# Patient Record
Sex: Male | Born: 1960 | Race: Black or African American | Hispanic: No | Marital: Married | Smoking: Never smoker
Health system: Southern US, Community
[De-identification: ages and names within clinical notes are randomized; demographics above are authoritative.]

## PROBLEM LIST (undated history)

## (undated) DIAGNOSIS — I10 Essential (primary) hypertension: Secondary | ICD-10-CM

## (undated) DIAGNOSIS — E785 Hyperlipidemia, unspecified: Secondary | ICD-10-CM

## (undated) HISTORY — DX: Hyperlipidemia, unspecified: E78.5

## (undated) HISTORY — DX: Essential (primary) hypertension: I10

---

## 1999-03-29 ENCOUNTER — Emergency Department (HOSPITAL_COMMUNITY): Admission: EM | Admit: 1999-03-29 | Discharge: 1999-03-29 | Payer: Self-pay | Admitting: *Deleted

## 1999-03-30 ENCOUNTER — Encounter: Payer: Self-pay | Admitting: Emergency Medicine

## 1999-11-26 ENCOUNTER — Emergency Department (HOSPITAL_COMMUNITY): Admission: EM | Admit: 1999-11-26 | Discharge: 1999-11-26 | Payer: Self-pay | Admitting: Emergency Medicine

## 2009-06-19 ENCOUNTER — Emergency Department (HOSPITAL_COMMUNITY): Admission: EM | Admit: 2009-06-19 | Discharge: 2009-06-20 | Payer: Self-pay | Admitting: Emergency Medicine

## 2012-04-20 ENCOUNTER — Ambulatory Visit: Payer: Self-pay

## 2012-04-20 ENCOUNTER — Other Ambulatory Visit: Payer: Self-pay | Admitting: Occupational Medicine

## 2012-04-20 DIAGNOSIS — M549 Dorsalgia, unspecified: Secondary | ICD-10-CM

## 2013-05-21 ENCOUNTER — Ambulatory Visit (INDEPENDENT_AMBULATORY_CARE_PROVIDER_SITE_OTHER): Payer: BC Managed Care – PPO | Admitting: Family Medicine

## 2013-05-21 VITALS — BP 120/82 | HR 61 | Temp 99.6°F | Resp 16 | Ht 70.0 in | Wt 180.0 lb

## 2013-05-21 DIAGNOSIS — H109 Unspecified conjunctivitis: Secondary | ICD-10-CM

## 2013-05-21 MED ORDER — TOBRAMYCIN 0.3 % OP SOLN: 1.0000 [drp] | Freq: Four times a day (QID) | OPHTHALMIC | Status: DC

## 2013-05-21 NOTE — Progress Notes (Signed)
This chart was scribed for Dylan Sidle, MD by Arlan Organ, ED Scribe. This patient was seen in room Room 5 and the patient's care was started 10:36 AM.   Chief Complaint:  Chief Complaint  Patient presents with  . Eye Drainage    x 2 days; left eye    HPI: 52 y.o. year old male presents with a 2 day history of gradual onset, gradually worsening eye redness and discharge involving the left eye. Pt states he does not feel he has a foreign body in his eye, and is currently not experiencing any pain to the eye. He denies contact use. He denies a h/o glaucoma. He denies any sick contacts, recent antibiotics, or recent travels. Denies any leg trauma, sedentary periods. He denies h/o cancer. Denies tobacco use.  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 No pertinent past medical history.   Home Meds: Prior to Admission medications   Medication Sig Start Date End Date Taking? Authorizing Provider  metFORMIN (GLUCOPHAGE) 500 MG tablet Take by mouth 2 (two) times daily with a meal.   Yes Historical Provider, MD  olmesartan (BENICAR) 5 MG tablet Take by mouth daily.   Yes Historical Provider, MD  rosuvastatin (CRESTOR) 10 MG tablet Take 10 mg by mouth daily.   Yes Historical Provider, MD    Allergies: Not on File  History   Social History  . Marital Status: Married    Spouse Name: N/A    Number of Children: N/A  . Years of Education: N/A   Occupational History  . Not on file.   Social History Main Topics  . Smoking status: Not on file  . Smokeless tobacco: Not on file  . Alcohol Use: Not on file  . Drug Use: Not on file  . Sexual Activity: Not on file   Other Topics Concern  . Not on file   Social History Narrative  . No narrative on file     Review of Systems: Constitutional: negative for chills, fever, night sweats or weight changes Cardiovascular: negative for chest pain or palpitations Respiratory: negative for hemoptysis, wheezing, or shortness of  breath Abdominal: negative for abdominal pain, nausea, vomiting or diarrhea Dermatological: negative for rash Neurologic: negative for headache   Physical Exam: Blood pressure 120/82, pulse 61, temperature 99.6 F (37.6 C), temperature source Oral, resp. rate 16, height 5\' 10"  (1.778 m), weight 180 lb (81.647 kg), SpO2 99.00%., Body mass index is 25.83 kg/(m^2). General: Well developed, well nourished, in no acute distress. Head: Normocephalic, atraumatic, nares are congested. Bilateral auditory canals clear, TM's are without perforation, pearly grey with reflective cone of light bilaterally. No sinus TTP. Oral cavity moist, dentition normal. Posterior pharynx with post nasal drip and mild erythema. No peritonsillar abscess or tonsillar exudate. Eyes:  Injection  Left eye, mild discharge at lid margins Neck: Supple. No thyromegaly. Full ROM. No lymphadenopathy. Lungs: Coarse breath sounds bilaterally without wheezes, rales, or rhonchi. Breathing is unlabored.  Heart: RRR with S1 S2. No murmurs, rubs, or gallops appreciated. Msk:  Strength and tone normal for age. Extremities: No clubbing or cyanosis. No edema. Neuro: Alert and oriented X 3. Moves all extremities spontaneously. CNII-XII grossly in tact. Psych:  Responds to questions appropriately with a normal affect.    ASSESSMENT AND PLAN:  52 y.o. year old male with conjunctivitis involving. -Conjunctivitis - Plan: tobramycin (TOBREX) 0.3 % ophthalmic solution   Pt ed given including hygiene measures and avoiding use of contact lenses -RTC  precautions -RTC 3-5 days if no improvement  Signed, Dylan Sidle, MD 05/21/2013 10:32 AM

## 2013-05-21 NOTE — Patient Instructions (Signed)

## 2013-05-27 ENCOUNTER — Ambulatory Visit (INDEPENDENT_AMBULATORY_CARE_PROVIDER_SITE_OTHER): Payer: BC Managed Care – PPO | Admitting: Emergency Medicine

## 2013-05-27 VITALS — BP 130/70 | HR 67 | Temp 98.9°F | Resp 16 | Ht 70.0 in | Wt 180.0 lb

## 2013-05-27 DIAGNOSIS — H109 Unspecified conjunctivitis: Secondary | ICD-10-CM

## 2013-05-27 MED ORDER — OFLOXACIN 0.3 % OP SOLN: 1.0000 [drp] | OPHTHALMIC | Status: DC

## 2013-05-27 NOTE — Progress Notes (Signed)
   Subjective:    Patient ID: Dylan Schaefer, male    DOB: [birth year], 52 y.o.   MRN: [id number]  HPI seen 6 days ago with conjunctivitis of the left. He was treated with tobrex antibiotic drops. His left eye seemed to be better and then in the last 2 days he has developed increasing redness and irritation of his right. He has significant mattering of his eyes and feels it does affect his vision.    Review of Systems     Objective:   Physical Exam the conjunctivae are irritated in both eyes. There is mattering of the lashes of both eyes. Pupils are equal and reactive to light. There is a small preauricular node on left.        Assessment & Plan:  I suspect this is EKC. We'll go ahead and put him on Ocuflox drops make referral for him to be seen Monday

## 2013-07-19 ENCOUNTER — Ambulatory Visit: Payer: BC Managed Care – PPO

## 2013-07-19 ENCOUNTER — Ambulatory Visit (INDEPENDENT_AMBULATORY_CARE_PROVIDER_SITE_OTHER): Payer: BC Managed Care – PPO | Admitting: Emergency Medicine

## 2013-07-19 VITALS — BP 116/72 | HR 66 | Temp 98.8°F | Resp 18 | Ht 69.5 in | Wt 175.0 lb

## 2013-07-19 DIAGNOSIS — R042 Hemoptysis: Secondary | ICD-10-CM

## 2013-07-19 DIAGNOSIS — J209 Acute bronchitis, unspecified: Secondary | ICD-10-CM

## 2013-07-19 MED ORDER — PROMETHAZINE-CODEINE 6.25-10 MG/5ML PO SYRP: 5.0000 mL | ORAL_SOLUTION | Freq: Four times a day (QID) | ORAL | Status: AC | PRN

## 2013-07-19 MED ORDER — AZITHROMYCIN 250 MG PO TABS: ORAL_TABLET | ORAL | Status: AC

## 2013-07-19 NOTE — Patient Instructions (Signed)
Bronchitis °Bronchitis is inflammation of the airways that extend from the windpipe into the lungs (bronchi). The inflammation often causes mucus to develop, which leads to a cough. If the inflammation becomes severe, it may cause shortness of breath. °CAUSES  °Bronchitis may be caused by:  °· Viral infections.   °· Bacteria.   °· Cigarette smoke.   °· Allergens, pollutants, and other irritants.   °SIGNS AND SYMPTOMS  °The most common symptom of bronchitis is a frequent cough that produces mucus. Other symptoms include: °· Fever.   °· Body aches.   °· Chest congestion.   °· Chills.   °· Shortness of breath.   °· Sore throat.   °DIAGNOSIS  °Bronchitis is usually diagnosed through a medical history and physical exam. Tests, such as chest X-rays, are sometimes done to rule out other conditions.  °TREATMENT  °You may need to avoid contact with whatever caused the problem (smoking, for example). Medicines are sometimes needed. These may include: °· Antibiotics. These may be prescribed if the condition is caused by bacteria. °· Cough suppressants. These may be prescribed for relief of cough symptoms.   °· Inhaled medicines. These may be prescribed to help open your airways and make it easier for you to breathe.   °· Steroid medicines. These may be prescribed for those with recurrent (chronic) bronchitis. °HOME CARE INSTRUCTIONS °· Get plenty of rest.   °· Drink enough fluids to keep your urine clear or pale yellow (unless you have a medical condition that requires fluid restriction). Increasing fluids may help thin your secretions and will prevent dehydration.   °· Only take over-the-counter or prescription medicines as directed by your health care provider. °· Only take antibiotics as directed. Make sure you finish them even if you start to feel better. °· Avoid secondhand smoke, irritating chemicals, and strong fumes. These will make bronchitis worse. If you are a smoker, quit smoking. Consider using nicotine gum or  skin patches to help control withdrawal symptoms. Quitting smoking will help your lungs heal faster.   °· Put a cool-mist humidifier in your bedroom at night to moisten the air. This may help loosen mucus. Change the water in the humidifier daily. You can also run the hot water in your shower and sit in the bathroom with the door closed for 5 10 minutes.   °· Follow up with your health care provider as directed.   °· Wash your hands frequently to avoid catching bronchitis again or spreading an infection to others.   °SEEK MEDICAL CARE IF: °Your symptoms do not improve after 1 week of treatment.  °SEEK IMMEDIATE MEDICAL CARE IF: °· Your fever increases. °· You have chills.   °· You have chest pain.   °· You have worsening shortness of breath.   °· You have bloody sputum. °· You faint.   °· You have lightheadedness. °· You have a severe headache.   °· You vomit repeatedly. °MAKE SURE YOU:  °· Understand these instructions. °· Will watch your condition. °· Will get help right away if you are not doing well or get worse. °Document Released: 05/19/2005 Document Revised: 03/09/2013 Document Reviewed: 01/11/2013 °ExitCare® Patient Information ©2014 [employer]. ° ° ° °Hemoptysis °Hemoptysis, which means coughing up blood, can be a sign of a minor problem or a serious medical condition. The blood that is coughed up may come from the lungs and airways. Coughed-up blood can also come from bleeding that occurs outside the lungs and airways. Blood can drain into the windpipe during a severe nosebleed or when blood is vomited from the stomach. Because hemoptysis can be a sign of something serious,   a medical evaluation is required. For some people with hemoptysis, no definite cause is ever identified. °CAUSES  °The most common cause of hemoptysis is bronchitis. Some other common causes include:  °· A ruptured blood vessel caused by coughing or an infection.   °· A medical condition that causes damage to the large air  passageways (bronchiectasis).   °· A blood clot in the lungs (pulmonary embolism).   °· Pneumonia.   °· Tuberculosis.   °· Breathing in a small foreign object.   °· Cancer. °For some people with hemoptysis, no definite cause is ever identified.   °HOME CARE INSTRUCTIONS °· Only take over-the-counter or prescription medicines as directed by your caregiver. Do not use cough suppressants unless your caregiver approves. °· If your caregiver prescribes antibiotic medicines, take them as directed. Finish them even if you start to feel better. °· Do not smoke. Also avoid secondhand smoke. °· Follow up with your caregiver as directed. °SEEK IMMEDIATE MEDICAL CARE IF:  °· You cough up bloody mucus for longer than a week. °· You have a blood-producing cough that is severe or getting worse. °· You have a blood-producing cough that comes and goes over time. °· You develop problems with your breathing.   °· You vomit blood. °· You develop bloody or black-colored stools. °· You have chest pain.   °· You develop night sweats. °· You feel faint or pass out.   °· You have a fever or persistent symptoms for more than 2 3 days.   °· You have a fever and your symptoms suddenly get worse. °MAKE SURE YOU: °· Understand these instructions. °· Will watch your condition. °· Will get help right away if you are not doing well or get worse. °Document Released: 07/28/2001 Document Revised: 05/05/2012 Document Reviewed: 03/05/2012 °ExitCare® Patient Information ©2014 [employer]. ° °

## 2013-07-19 NOTE — Progress Notes (Signed)
[facility] [address], [place] [address] [phone]  Date:  07/19/2013   Name:  Dylan NapRobert S Arutyunyan   DOB:  03/01/1961   MRN:  [id number]  PCP:  Geraldo PitterBLAND,VEITA J, MD    Chief Complaint: Cough and Chills   History of Present Illness:  Dylan NapRobert S Klugh is a 53 y.o. very pleasant male patient who presents with the following:  Cough since late last week with purulent sputum occasionally blood tinged.  No wheezing or shortness of breath.  Some pain with coughing. Had sore throat Friday.  Now resolved.  Had a fever and chills over the weekend.  No coryza or nausea or vomiting.  No improvement with over the counter medications or other home remedies. Denies other complaint or health concern today.   Non smoker.    There are no active problems to display for this patient.   No past medical history on file.  No past surgical history on file.  History  Substance Use Topics  . Smoking status: Never Smoker   . Smokeless tobacco: Not on file  . Alcohol Use: Not on file    Family History  Problem Relation Age of Onset  . Hypertension Mother   . Hypertension Father     No Known Allergies  Medication list has been reviewed and updated.  Current Outpatient Prescriptions on File Prior to Visit  Medication Sig Dispense Refill  . metFORMIN (GLUCOPHAGE) 500 MG tablet Take by mouth 2 (two) times daily with a meal.      . olmesartan (BENICAR) 5 MG tablet Take by mouth daily.      . rosuvastatin (CRESTOR) 10 MG tablet Take 10 mg by mouth daily.       No current facility-administered medications on file prior to visit.    Review of Systems:  As per HPI, otherwise negative.    Physical Examination: Filed Vitals:   07/19/13 1349  BP: 116/72  Pulse: 66  Temp: 98.8 F (37.1 C)  Resp: 18   Filed Vitals:   07/19/13 1349  Height: 5' 9.5" (1.765 m)  Weight: 175 lb (79.379 kg)   Body mass index is 25.48 kg/(m^2). Ideal Body Weight: Weight in (lb) to have BMI  = 25: 171.4  GEN: WDWN, NAD, Non-toxic, A & O x 3 HEENT: Atraumatic, Normocephalic. Neck supple. No masses, No LAD. Ears and Nose: No external deformity. CV: RRR, No M/G/R. No JVD. No thrill. No extra heart sounds. PULM: CTA B, no wheezes, crackles, rhonchi. No retractions. No resp. distress. No accessory muscle use. ABD: S, NT, ND, +BS. No rebound. No HSM. EXTR: No c/c/e NEURO Normal gait.  PSYCH: Normally interactive. Conversant. Not depressed or anxious appearing.  Calm demeanor.    Assessment and Plan: Bronchitis hemoptysis Signed,  Phillips OdorJeffery Tayshun Gappa, MD   [facility] reading (PRIMARY) by  Dr. Dareen PianoAnderson.  Negative chest.

## 2016-07-26 ENCOUNTER — Ambulatory Visit (INDEPENDENT_AMBULATORY_CARE_PROVIDER_SITE_OTHER): Payer: BLUE CROSS/BLUE SHIELD | Admitting: Emergency Medicine

## 2016-07-26 VITALS — BP 124/70 | HR 55 | Temp 98.4°F | Ht 69.5 in | Wt 169.8 lb

## 2016-07-26 DIAGNOSIS — Z202 Contact with and (suspected) exposure to infections with a predominantly sexual mode of transmission: Secondary | ICD-10-CM

## 2016-07-26 LAB — POCT URINALYSIS DIP (MANUAL ENTRY)
Bilirubin, UA: NEGATIVE
Glucose, UA: NEGATIVE
Ketones, POC UA: NEGATIVE
Leukocytes, UA: NEGATIVE
Nitrite, UA: NEGATIVE
PH UA: 7.5
RBC UA: NEGATIVE
Spec Grav, UA: 1.02
UROBILINOGEN UA: 1

## 2016-07-26 MED ORDER — METRONIDAZOLE 500 MG PO TABS: 500.0000 mg | ORAL_TABLET | Freq: Three times a day (TID) | ORAL | 0 refills | Status: AC

## 2016-07-26 NOTE — Progress Notes (Signed)
Dylan Schaefer 56 y.o.   Chief Complaint  Patient presents with  . Exposure to STD    HISTORY OF PRESENT ILLNESS: This is a 56 y.o. male exposed to trichomonas; seeking treatment and advise. Asymptomatic.  HPI   Prior to Admission medications   Medication Sig Start Date End Date Taking? Authorizing Provider  metFORMIN (GLUCOPHAGE) 500 MG tablet Take by mouth 2 (two) times daily with a meal.   Yes Historical Provider, MD  olmesartan (BENICAR) 5 MG tablet Take by mouth daily.   Yes Historical Provider, MD  promethazine-codeine (PHENERGAN WITH CODEINE) 6.25-10 MG/5ML syrup Take 5-10 mLs by mouth every 6 (six) hours as needed. 07/19/13  Yes Carmelina Dane, MD  rosuvastatin (CRESTOR) 10 MG tablet Take 10 mg by mouth daily.   Yes Historical Provider, MD  azithromycin (ZITHROMAX) 250 MG tablet Take 2 tabs PO x 1 dose, then 1 tab PO QD x 4 days Patient not taking: Reported on 07/26/2016 07/19/13   Carmelina Dane, MD    No Known Allergies  There are no active problems to display for this patient.   Past Medical History:  Diagnosis Date  . Hyperlipidemia   . Hypertension     History reviewed. No pertinent surgical history.  Social History   Social History  . Marital status: Married    Spouse name: N/A  . Number of children: N/A  . Years of education: N/A   Occupational History  . Not on file.   Social History Main Topics  . Smoking status: Never Smoker  . Smokeless tobacco: Never Used  . Alcohol use 1.2 oz/week    2 Cans of beer per week  . Drug use: No  . Sexual activity: Not on file   Other Topics Concern  . Not on file   Social History Narrative  . No narrative on file    Family History  Problem Relation Age of Onset  . Hypertension Mother   . Hypertension Father      Review of Systems  Constitutional: Negative for chills and fever.  Respiratory: Negative for cough and shortness of breath.   Gastrointestinal: Negative for abdominal pain,  diarrhea, nausea and vomiting.  Genitourinary: Negative for dysuria, frequency, hematuria and urgency.  Musculoskeletal: Negative for myalgias.  Skin: Negative for rash.  Neurological: Negative for dizziness and headaches.  All other systems reviewed and are negative.   Vitals:   07/26/16 0842  BP: 124/70  Pulse: (!) 55  Temp: 98.4 F (36.9 C)    Physical Exam  Constitutional: He is oriented to person, place, and time. He appears well-developed and well-nourished.  HENT:  Head: Normocephalic and atraumatic.  Eyes: Conjunctivae and EOM are normal. Pupils are equal, round, and reactive to light.  Neck: Normal range of motion. Neck supple.  Cardiovascular: Normal rate, regular rhythm and normal heart sounds.   Pulmonary/Chest: Effort normal.  Abdominal: Soft. He exhibits no distension. There is no tenderness.  Musculoskeletal: Normal range of motion.  Neurological: He is alert and oriented to person, place, and time.  Skin: Skin is warm and dry. Capillary refill takes less than 2 seconds.  Psychiatric: He has a normal mood and affect. His behavior is normal.  Vitals reviewed.    ASSESSMENT & PLAN: Dylan Schaefer was seen today for exposure to std.  Diagnoses and all orders for this visit:  Exposure to trichomonas -     Chlamydia trachomatis, DNA, amp probe -     Gonococcus DNA, PCR -  Urine culture -     POCT urinalysis dipstick -     Trichomonas vaginalis, RNA  Other orders -     metroNIDAZOLE (FLAGYL) 500 MG tablet; Take 1 tablet (500 mg total) by mouth 3 (three) times daily.   Patient Instructions       IF you received an x-ray today, you will receive an invoice from [facility]. Please contact [facility] at [phone] with questions or concerns regarding your invoice.   IF you received labwork today, you will receive an invoice from [facility]. Please contact [facility] at [phone] with questions or concerns regarding your invoice.   Our  billing staff will not be able to assist you with questions regarding bills from these companies.  You will be contacted with the lab results as soon as they are available. The fastest way to get your results is to activate your My Chart account. Instructions are located on the last page of this paperwork. If you have not heard from us regarding the results in 2 weeks, please contact this office.     Trichomoniasis Trichomoniasis is an infection caused by an organism called Trichomonas. The infection can affect both women and men. In women, the outer male genitalia and the vagina are affected. In men, the penis is mainly affected, but the prostate and other reproductive organs can also be involved. Trichomoniasis is a sexually transmitted infection (STI) and is most often passed to another person through sexual contact.  RISK FACTORS  Having unprotected sexual intercourse.  Having sexual intercourse with an infected partner. SIGNS AND SYMPTOMS  Symptoms of trichomoniasis in women include:  Abnormal gray-green frothy vaginal discharge.  Itching and irritation of the vagina.  Itching and irritation of the area outside the vagina. Symptoms of trichomoniasis in men include:   Penile discharge with or without pain.  Pain during urination. This results from inflammation of the urethra. DIAGNOSIS  Trichomoniasis may be found during a Pap test or physical exam. Your health care provider may use one of the following methods to help diagnose this infection:  Testing the pH of the vagina with a test tape.  Using a vaginal swab test that checks for the Trichomonas organism. A test is available that provides results within a few minutes.  Examining a urine sample.  Testing vaginal secretions. Your health care provider may test you for other STIs, including HIV. TREATMENT   You may be given medicine to fight the infection. Women should inform their health care provider if they could be or  are pregnant. Some medicines used to treat the infection should not be taken during pregnancy.  Your health care provider may recommend over-the-counter medicines or creams to decrease itching or irritation.  Your sexual partner will need to be treated if infected.  Your health care provider may test you for infection again 3 months after treatment. HOME CARE INSTRUCTIONS   Take medicines only as directed by your health care provider.  Take over-the-counter medicine for itching or irritation as directed by your health care provider.  Do not have sexual intercourse while you have the infection.  Women should not douche or wear tampons while they have the infection.  Discuss your infection with your partner. Your partner may have gotten the infection from you, or you may have gotten it from your partner.  Have your sex partner get examined and treated if necessary.  Practice safe, informed, and protected sex.  See your health care provider for other STI testing. SEEK  MEDICAL CARE IF:   You still have symptoms after you finish your medicine.  You develop abdominal pain.  You have pain when you urinate.  You have bleeding after sexual intercourse.  You develop a rash.  Your medicine makes you sick or makes you throw up (vomit). MAKE SURE YOU:  Understand these instructions.  Will watch your condition.  Will get help right away if you are not doing well or get worse. This information is not intended to replace advice given to you by your health care provider. Make sure you discuss any questions you have with your health care provider. Document Released: 11/12/2000 Document Revised: 06/09/2014 Document Reviewed: 02/28/2013 Elsevier Interactive Patient Education  2017 [employer]      Edwina Barth, MD [facility]

## 2016-07-26 NOTE — Patient Instructions (Addendum)
   IF you received an x-ray today, you will receive an invoice from [facility]. Please contact [facility] at [phone] with questions or concerns regarding your invoice.   IF you received labwork today, you will receive an invoice from [facility]. Please contact [facility] at [phone] with questions or concerns regarding your invoice.   Our billing staff will not be able to assist you with questions regarding bills from these companies.  You will be contacted with the lab results as soon as they are available. The fastest way to get your results is to activate your My Chart account. Instructions are located on the last page of this paperwork. If you have not heard from us regarding the results in 2 weeks, please contact this office.     Trichomoniasis Trichomoniasis is an infection caused by an organism called Trichomonas. The infection can affect both women and men. In women, the outer male genitalia and the vagina are affected. In men, the penis is mainly affected, but the prostate and other reproductive organs can also be involved. Trichomoniasis is a sexually transmitted infection (STI) and is most often passed to another person through sexual contact.  RISK FACTORS  Having unprotected sexual intercourse.  Having sexual intercourse with an infected partner. SIGNS AND SYMPTOMS  Symptoms of trichomoniasis in women include:  Abnormal gray-green frothy vaginal discharge.  Itching and irritation of the vagina.  Itching and irritation of the area outside the vagina. Symptoms of trichomoniasis in men include:   Penile discharge with or without pain.  Pain during urination. This results from inflammation of the urethra. DIAGNOSIS  Trichomoniasis may be found during a Pap test or physical exam. Your health care provider may use one of the following methods to help diagnose this infection:  Testing the pH of the vagina with a test tape.  Using a vaginal swab  test that checks for the Trichomonas organism. A test is available that provides results within a few minutes.  Examining a urine sample.  Testing vaginal secretions. Your health care provider may test you for other STIs, including HIV. TREATMENT   You may be given medicine to fight the infection. Women should inform their health care provider if they could be or are pregnant. Some medicines used to treat the infection should not be taken during pregnancy.  Your health care provider may recommend over-the-counter medicines or creams to decrease itching or irritation.  Your sexual partner will need to be treated if infected.  Your health care provider may test you for infection again 3 months after treatment. HOME CARE INSTRUCTIONS   Take medicines only as directed by your health care provider.  Take over-the-counter medicine for itching or irritation as directed by your health care provider.  Do not have sexual intercourse while you have the infection.  Women should not douche or wear tampons while they have the infection.  Discuss your infection with your partner. Your partner may have gotten the infection from you, or you may have gotten it from your partner.  Have your sex partner get examined and treated if necessary.  Practice safe, informed, and protected sex.  See your health care provider for other STI testing. SEEK MEDICAL CARE IF:   You still have symptoms after you finish your medicine.  You develop abdominal pain.  You have pain when you urinate.  You have bleeding after sexual intercourse.  You develop a rash.  Your medicine makes you sick or makes you throw up (vomit). MAKE   SURE YOU:  Understand these instructions.  Will watch your condition.  Will get help right away if you are not doing well or get worse. This information is not intended to replace advice given to you by your health care provider. Make sure you discuss any questions you have with  your health care provider. Document Released: 11/12/2000 Document Revised: 06/09/2014 Document Reviewed: 02/28/2013 Elsevier Interactive Patient Education  2017 [employer]vier Inc.  

## 2016-07-28 LAB — URINE CULTURE: Organism ID, Bacteria: NO GROWTH

## 2016-07-29 LAB — TRICHOMONAS VAGINALIS, PROBE AMP: TRICH VAG BY NAA: POSITIVE — AB

## 2016-07-29 LAB — GONOCOCCUS DNA, PCR: Neisseria gonorrhoeae by PCR: NEGATIVE

## 2016-07-29 LAB — CHLAMYDIA TRACHOMATIS, DNA, AMP PROBE: Chlamydia trachomatis, NAA: NEGATIVE

## 2016-08-06 ENCOUNTER — Other Ambulatory Visit: Payer: Self-pay | Admitting: Emergency Medicine

## 2016-12-13 ENCOUNTER — Telehealth: Payer: Self-pay | Admitting: Internal Medicine

## 2016-12-13 NOTE — Telephone Encounter (Signed)
Patient of Dr. Haywood PaoHung's contacted me as the on call M.D. Today Patient reports he has a colonoscopy, which is a rescheduled exam, with Dr. Elnoria HowardHung on Tuesday He has misplaced his procedure instructions and bowel preparation prescription  I reassured him that there is still time to get the prescription on Monday. I advised low residue diet through the weekend and clear liquids beginning at midnight on Monday, 12/15/2016 He will contact Dr. Haywood PaoHung's office on Monday regarding bowel preparation and procedure instructions He thanked me for the call

## 2019-04-08 ENCOUNTER — Other Ambulatory Visit: Payer: Self-pay | Admitting: Family Medicine

## 2019-04-22 LAB — MOLECULAR ANCILLARY ONLY
Bacterial Vaginitis (gardnerella): NEGATIVE
Candida Glabrata: NEGATIVE
Candida Vaginitis: NEGATIVE
Chlamydia: NEGATIVE
Comment: NEGATIVE
Comment: NEGATIVE
Comment: NEGATIVE
Comment: NEGATIVE
Comment: NEGATIVE
Comment: NORMAL
Neisseria Gonorrhea: NEGATIVE
Trichomonas: POSITIVE — AB

## 2019-09-17 ENCOUNTER — Other Ambulatory Visit (HOSPITAL_COMMUNITY)
Admission: RE | Admit: 2019-09-17 | Discharge: 2019-09-17 | Disposition: A | Discharge status: Home / Self Care | Payer: BC Managed Care – PPO | Source: Ambulatory Visit | Attending: Family Medicine | Admitting: Family Medicine

## 2019-09-17 ENCOUNTER — Other Ambulatory Visit: Payer: Self-pay | Admitting: Family Medicine

## 2019-09-17 DIAGNOSIS — R351 Nocturia: Secondary | ICD-10-CM | POA: Insufficient documentation

## 2019-09-17 DIAGNOSIS — Z113 Encounter for screening for infections with a predominantly sexual mode of transmission: Secondary | ICD-10-CM | POA: Diagnosis present

## 2019-10-07 LAB — MOLECULAR ANCILLARY ONLY
Bacterial Vaginitis (gardnerella): NEGATIVE
Candida Glabrata: NEGATIVE
Candida Vaginitis: NEGATIVE
Chlamydia: NEGATIVE
Comment: NEGATIVE
Comment: NEGATIVE
Comment: NEGATIVE
Comment: NEGATIVE
Comment: NEGATIVE
Comment: NORMAL
Neisseria Gonorrhea: NEGATIVE
Trichomonas: NEGATIVE

## 2020-05-27 ENCOUNTER — Emergency Department (HOSPITAL_COMMUNITY)
Admission: EM | Admit: 2020-05-27 | Discharge: 2020-05-27 | Disposition: A | Discharge status: Home / Self Care | Payer: BC Managed Care – PPO | Attending: Emergency Medicine | Admitting: Emergency Medicine

## 2020-05-27 ENCOUNTER — Other Ambulatory Visit: Payer: Self-pay

## 2020-05-27 ENCOUNTER — Encounter (HOSPITAL_COMMUNITY): Payer: Self-pay | Admitting: Emergency Medicine

## 2020-05-27 ENCOUNTER — Emergency Department (HOSPITAL_COMMUNITY): Payer: BC Managed Care – PPO

## 2020-05-27 DIAGNOSIS — Z7982 Long term (current) use of aspirin: Secondary | ICD-10-CM | POA: Insufficient documentation

## 2020-05-27 DIAGNOSIS — I10 Essential (primary) hypertension: Secondary | ICD-10-CM | POA: Diagnosis not present

## 2020-05-27 DIAGNOSIS — Z79899 Other long term (current) drug therapy: Secondary | ICD-10-CM | POA: Insufficient documentation

## 2020-05-27 DIAGNOSIS — R42 Dizziness and giddiness: Secondary | ICD-10-CM | POA: Diagnosis not present

## 2020-05-27 DIAGNOSIS — H81399 Other peripheral vertigo, unspecified ear: Secondary | ICD-10-CM

## 2020-05-27 LAB — COMPREHENSIVE METABOLIC PANEL
ALT: 36 U/L (ref 0–44)
AST: 26 U/L (ref 15–41)
Albumin: 5.2 g/dL — ABNORMAL HIGH (ref 3.5–5.0)
Alkaline Phosphatase: 66 U/L (ref 38–126)
Anion gap: 11 (ref 5–15)
BUN: 16 mg/dL (ref 6–20)
CO2: 29 mmol/L (ref 22–32)
Calcium: 10.1 mg/dL (ref 8.9–10.3)
Chloride: 98 mmol/L (ref 98–111)
Creatinine, Ser: 1.27 mg/dL — ABNORMAL HIGH (ref 0.61–1.24)
GFR, Estimated: >60 mL/min (ref >60)
Glucose, Bld: 99 mg/dL (ref 70–99)
Potassium: 4.5 mmol/L (ref 3.5–5.1)
Sodium: 138 mmol/L (ref 135–145)
Total Bilirubin: 0.6 mg/dL (ref 0.3–1.2)
Total Protein: 8.8 g/dL — ABNORMAL HIGH (ref 6.5–8.1)

## 2020-05-27 LAB — CBC
HCT: 51.5 % (ref 39.0–52.0)
Hemoglobin: 17.1 g/dL — ABNORMAL HIGH (ref 13.0–17.0)
MCH: 28.8 pg (ref 26.0–34.0)
MCHC: 33.2 g/dL (ref 30.0–36.0)
MCV: 86.8 fL (ref 80.0–100.0)
Platelets: 261 10*3/uL (ref 150–400)
RBC: 5.93 MIL/uL — ABNORMAL HIGH (ref 4.22–5.81)
RDW: 13 % (ref 11.5–15.5)
WBC: 20.2 10*3/uL — ABNORMAL HIGH (ref 4.0–10.5)
nRBC: 0 % (ref 0.0–0.2)

## 2020-05-27 LAB — RAPID URINE DRUG SCREEN, HOSP PERFORMED
Amphetamines: NOT DETECTED
Barbiturates: NOT DETECTED
Benzodiazepines: NOT DETECTED
Cocaine: NOT DETECTED
Opiates: NOT DETECTED
Tetrahydrocannabinol: NOT DETECTED

## 2020-05-27 LAB — DIFFERENTIAL
Abs Immature Granulocytes: 0.11 10*3/uL — ABNORMAL HIGH (ref 0.00–0.07)
Basophils Absolute: 0 10*3/uL (ref 0.0–0.1)
Basophils Relative: 0 %
Eosinophils Absolute: 0 10*3/uL (ref 0.0–0.5)
Eosinophils Relative: 0 %
Immature Granulocytes: 1 %
Lymphocytes Relative: 4 %
Lymphs Abs: 0.8 10*3/uL (ref 0.7–4.0)
Monocytes Absolute: 0.9 10*3/uL (ref 0.1–1.0)
Monocytes Relative: 4 %
Neutro Abs: 18.4 10*3/uL — ABNORMAL HIGH (ref 1.7–7.7)
Neutrophils Relative %: 91 %

## 2020-05-27 LAB — URINALYSIS, ROUTINE W REFLEX MICROSCOPIC
Bilirubin Urine: NEGATIVE
Glucose, UA: NEGATIVE mg/dL
Hgb urine dipstick: NEGATIVE
Ketones, ur: NEGATIVE mg/dL
Leukocytes,Ua: NEGATIVE
Nitrite: NEGATIVE
Protein, ur: NEGATIVE mg/dL
Specific Gravity, Urine: 1.005 (ref 1.005–1.030)
pH: 6 (ref 5.0–8.0)

## 2020-05-27 LAB — PROTIME-INR
INR: 1 (ref 0.8–1.2)
Prothrombin Time: 13 seconds (ref 11.4–15.2)

## 2020-05-27 LAB — I-STAT CHEM 8, ED
BUN: 15 mg/dL (ref 6–20)
Calcium, Ion: 1.24 mmol/L (ref 1.15–1.40)
Chloride: 99 mmol/L (ref 98–111)
Creatinine, Ser: 1.2 mg/dL (ref 0.61–1.24)
Glucose, Bld: 103 mg/dL — ABNORMAL HIGH (ref 70–99)
HCT: 51 % (ref 39.0–52.0)
Hemoglobin: 17.3 g/dL — ABNORMAL HIGH (ref 13.0–17.0)
Potassium: 4.5 mmol/L (ref 3.5–5.1)
Sodium: 137 mmol/L (ref 135–145)
TCO2: 28 mmol/L (ref 22–32)

## 2020-05-27 LAB — CBG MONITORING, ED: Glucose-Capillary: 136 mg/dL — ABNORMAL HIGH (ref 70–99)

## 2020-05-27 LAB — APTT: aPTT: 36 seconds (ref 24–36)

## 2020-05-27 MED ORDER — DIAZEPAM 2 MG PO TABS: 2.0000 mg | ORAL_TABLET | Freq: Four times a day (QID) | ORAL | 0 refills | Status: AC | PRN

## 2020-05-27 MED ORDER — MECLIZINE HCL 25 MG PO TABS: 25.0000 mg | ORAL_TABLET | Freq: Three times a day (TID) | ORAL | 0 refills | Status: AC | PRN

## 2020-05-27 MED ORDER — DIAZEPAM 2 MG PO TABS
2.0000 mg | ORAL_TABLET | Freq: Once | ORAL | Status: AC
  Administered 2020-05-27: 16:00:00 2 mg via ORAL
  Filled 2020-05-27: qty 1

## 2020-05-27 MED ORDER — IOHEXOL 350 MG/ML SOLN
80.0000 mL | Freq: Once | INTRAVENOUS | Status: AC | PRN
  Administered 2020-05-27: 80 mL via INTRAVENOUS

## 2020-05-27 MED ORDER — MECLIZINE HCL 25 MG PO TABS
25.0000 mg | ORAL_TABLET | Freq: Once | ORAL | Status: AC
  Administered 2020-05-27: 16:00:00 25 mg via ORAL
  Filled 2020-05-27: qty 1

## 2020-05-27 NOTE — ED Provider Notes (Signed)
[facility]-EMERGENCY DEPT Provider Note   CSN: [id number] Arrival date & time: 05/27/20  1119     History Chief Complaint  Patient presents with  . Dizziness    Dylan Schaefer is a 59 y.o. male presents emergency department room spinning dizziness.  Patient states that he woke from sleep at 2:00 in the morning with room spinning dizziness.  He states when he stood up to go to the bathroom it got worse.  He was stumbling and off balance and felt like he was going to vomit but only dry heaves in the bathroom.  Patient states that eventually it stopped and he was able to go back to sleep.  He has never had anything like it before.  When he awoke he was doing well this morning and proceeded to go to work.  He is a Naval architect for [employer].  When he got to work he began to turn his vehicle on when the room spinning dizziness hit suddenly again.  He states that he stopped the truck and got out.  He called his daughter to come get him to bring him here.  On the way he had several episodes read pullover and dry heaves but did not vomit.  While waiting in the waiting room his symptoms improved but then suddenly started again.  He states that his symptoms are currently moderate.  He is never had anything like this before.  He denies changes in vision, unilateral weakness, difficulty with speech or swallowing.  Aside from the one episode of ataxia yesterday he has had no difficulty ambulating today.  He did not try anything to help with his symptoms.  He states that it feels like being too drunk or being on a ride that makes you sick.  He has a past medical history of hyper glycemia, hyperlipidemia, hypertension.  He denies a history of alcohol or tobacco abuse.  HPI     Past Medical History:  Diagnosis Date  . Hyperlipidemia   . Hypertension     There are no problems to display for this patient.   History reviewed. No pertinent surgical history.     Family History  Problem  Relation Age of Onset  . Hypertension Mother   . Hypertension Father     Social History   Tobacco Use  . Smoking status: Never Smoker  . Smokeless tobacco: Never Used  Substance Use Topics  . Alcohol use: Yes    Alcohol/week: 2.0 standard drinks    Types: 2 Cans of beer per week  . Drug use: No    Home Medications Prior to Admission medications   Medication Sig Start Date End Date Taking? Authorizing Provider  aspirin EC 81 MG tablet Take 81 mg by mouth daily as needed for mild pain. Swallow whole.   Yes [provider]  olmesartan-hydrochlorothiazide (BENICAR HCT) 40-25 MG tablet Take 1 tablet by mouth daily.   Yes [provider]  rosuvastatin (CRESTOR) 10 MG tablet Take 10 mg by mouth daily.   Yes [provider]  sitaGLIPtin (JANUVIA) 100 MG tablet Take 100 mg by mouth daily.   Yes [provider]  Vitamin D, Ergocalciferol, (DRISDOL) 1.25 MG (50000 UNIT) CAPS capsule Take 50,000 Units by mouth every 7 (seven) days. Fridays   Yes [provider]  azithromycin (ZITHROMAX) 250 MG tablet Take 2 tabs PO x 1 dose, then 1 tab PO QD x 4 days Patient not taking: No sig reported 07/19/13  Carmelina Dane, MD  diazepam (VALIUM) 2 MG tablet Take 1 tablet (2 mg total) by mouth every 6 (six) hours as needed (Severe dizziness). 05/27/20   Arthor Captain, PA-C  meclizine (ANTIVERT) 25 MG tablet Take 1 tablet (25 mg total) by mouth 3 (three) times daily as needed for dizziness or nausea. 05/27/20   Arthor Captain, PA-C  metFORMIN (GLUCOPHAGE) 500 MG tablet Take by mouth 2 (two) times daily with a meal. Patient not taking: No sig reported    [provider]  metroNIDAZOLE (FLAGYL) 500 MG tablet Take 1 tablet (500 mg total) by mouth 3 (three) times daily. Patient not taking: No sig reported 07/26/16   Georgina Quint, MD  promethazine-codeine [facility] WITH CODEINE) 6.25-10 MG/5ML syrup Take 5-10 mLs by mouth every 6 (six) hours as  needed. Patient not taking: No sig reported 07/19/13   Carmelina Dane, MD    Allergies    Patient has no known allergies.  Review of Systems   Review of Systems Ten systems reviewed and are negative for acute change, except as noted in the HPI.   Physical Exam Updated Vital Signs BP (!) 144/96 (BP Location: Left Arm)   Pulse 65   Temp 97.7 F (36.5 C) (Oral)   Resp 20   Ht 5\' 8"  (1.727 m)   Wt 90.7 kg   SpO2 97%   BMI 30.41 kg/m   Physical Exam Vitals and nursing note reviewed.  Constitutional:      General: He is not in acute distress.    Appearance: He is well-developed and well-nourished. He is not diaphoretic.  HENT:     Head: Normocephalic and atraumatic.  Eyes:     General: No scleral icterus.    Conjunctiva/sclera: Conjunctivae normal.  Cardiovascular:     Rate and Rhythm: Normal rate and regular rhythm.     Heart sounds: Normal heart sounds.  Pulmonary:     Effort: Pulmonary effort is normal. No respiratory distress.     Breath sounds: Normal breath sounds.  Abdominal:     Palpations: Abdomen is soft.     Tenderness: There is no abdominal tenderness.  Musculoskeletal:        General: No edema.     Cervical back: Normal range of motion and neck supple.  Skin:    General: Skin is warm and dry.  Neurological:     Mental Status: He is alert.     Comments: Speech is clear and goal oriented, follows commands Major Cranial nerves without deficit, no facial droop Normal strength in upper and lower extremities bilaterally including dorsiflexion and plantar flexion, strong and equal grip strength Sensation normal to light and sharp touch Moves extremities without ataxia, coordination intact Normal finger to nose and rapid alternating movements Neg romberg, no pronator drift Normal gait Normal heel-shin and balance   Psychiatric:        Behavior: Behavior normal.     ED Results / Procedures / Treatments   Labs (all labs ordered are listed, but only  abnormal results are displayed) Labs Reviewed  CBC - Abnormal; Notable for the following components:      Result Value   WBC 20.2 (*)    RBC 5.93 (*)    Hemoglobin 17.1 (*)    All other components within normal limits  DIFFERENTIAL - Abnormal; Notable for the following components:   Neutro Abs 18.4 (*)    Abs Immature Granulocytes 0.11 (*)    All other components within normal  limits  COMPREHENSIVE METABOLIC PANEL - Abnormal; Notable for the following components:   Creatinine, Ser 1.27 (*)    Total Protein 8.8 (*)    Albumin 5.2 (*)    All other components within normal limits  URINALYSIS, ROUTINE W REFLEX MICROSCOPIC - Abnormal; Notable for the following components:   Color, Urine STRAW (*)    All other components within normal limits  CBG MONITORING, ED - Abnormal; Notable for the following components:   Glucose-Capillary 136 (*)    All other components within normal limits  I-STAT CHEM 8, ED - Abnormal; Notable for the following components:   Glucose, Bld 103 (*)    Hemoglobin 17.3 (*)    All other components within normal limits  PROTIME-INR  APTT  RAPID URINE DRUG SCREEN, HOSP PERFORMED    EKG EKG Interpretation  Date/Time:  Sunday May 27 2020 15:06:14 EST Ventricular Rate:  71 PR Interval:    QRS Duration: 71 QT Interval:  380 QTC Calculation: 413 R Axis:   73 Text Interpretation: Sinus rhythm Borderline prolonged PR interval Biatrial enlargement No previous ECGs available Confirmed by Alvira MondaySchlossman, Erin ([id number]) on 05/27/2020 5:55:29 PM   Radiology CT Angio Head W or Wo Contrast  Result Date: 05/27/2020 CLINICAL DATA:  Dizziness.  Nausea. EXAM: CT ANGIOGRAPHY HEAD AND NECK TECHNIQUE: Multidetector CT imaging of the head and neck was performed using the standard protocol during bolus administration of intravenous contrast. Multiplanar CT image reconstructions and MIPs were obtained to evaluate the vascular anatomy. Carotid stenosis measurements (when  applicable) are obtained utilizing NASCET criteria, using the distal internal carotid diameter as the denominator. CONTRAST:  80mL OMNIPAQUE IOHEXOL 350 MG/ML SOLN COMPARISON:  None. FINDINGS: CT HEAD FINDINGS Brain: There is no evidence of an acute infarct, intracranial hemorrhage, mass, midline shift, or extra-axial fluid collection. The ventricles and sulci are normal. Scattered hypodensities in the cerebral white matter nonspecific but compatible with mild chronic small vessel ischemic disease. Vascular: No hyperdense vessel. Skull: No fracture or suspicious osseous lesion. Sinuses: Visualized paranasal sinuses and mastoid air cells are clear. Orbits: Unremarkable. Review of the MIP images confirms the above findings CTA NECK FINDINGS Aortic arch: Standard 3 vessel aortic arch with widely patent arch vessel origins. Right carotid system: Patent without evidence of stenosis or dissection. Left carotid system: Patent without evidence of stenosis or dissection. Vertebral arteries: Patent without evidence of stenosis or dissection. Mildly dominant left vertebral artery. Skeleton: No acute osseous abnormality or suspicious osseous lesion. Other neck: No evidence of cervical lymphadenopathy or mass. Upper chest: Clear lung apices. Review of the MIP images confirms the above findings CTA HEAD FINDINGS Anterior circulation: The internal carotid arteries are widely patent from skull base to carotid termini. ACAs and MCAs are patent without evidence of a proximal branch occlusion or significant proximal stenosis. No aneurysm is identified. Posterior circulation: The intracranial vertebral arteries are widely patent to the basilar. Patent PICA and SCA origins are seen bilaterally. The basilar artery is widely patent. Posterior communicating arteries are not clearly identified and may be diminutive or absent. Both PCAs are patent with mild branch vessel irregularity and attenuation but no evidence of a significant proximal  stenosis. No aneurysm is identified. Venous sinuses: Patent. Anatomic variants: None. Review of the MIP images confirms the above findings IMPRESSION: 1. No evidence of acute intracranial abnormality. 2. Mild chronic small vessel ischemic disease in the cerebral white matter. 3. No large vessel occlusion, significant stenosis, or aneurysm in the head and neck. Electronically  Signed   By: Sebastian Ache M.D.   On: 05/27/2020 17:31   CT Angio Neck W and/or Wo Contrast  Result Date: 05/27/2020 CLINICAL DATA:  Dizziness.  Nausea. EXAM: CT ANGIOGRAPHY HEAD AND NECK TECHNIQUE: Multidetector CT imaging of the head and neck was performed using the standard protocol during bolus administration of intravenous contrast. Multiplanar CT image reconstructions and MIPs were obtained to evaluate the vascular anatomy. Carotid stenosis measurements (when applicable) are obtained utilizing NASCET criteria, using the distal internal carotid diameter as the denominator. CONTRAST:  23mL OMNIPAQUE IOHEXOL 350 MG/ML SOLN COMPARISON:  None. FINDINGS: CT HEAD FINDINGS Brain: There is no evidence of an acute infarct, intracranial hemorrhage, mass, midline shift, or extra-axial fluid collection. The ventricles and sulci are normal. Scattered hypodensities in the cerebral white matter nonspecific but compatible with mild chronic small vessel ischemic disease. Vascular: No hyperdense vessel. Skull: No fracture or suspicious osseous lesion. Sinuses: Visualized paranasal sinuses and mastoid air cells are clear. Orbits: Unremarkable. Review of the MIP images confirms the above findings CTA NECK FINDINGS Aortic arch: Standard 3 vessel aortic arch with widely patent arch vessel origins. Right carotid system: Patent without evidence of stenosis or dissection. Left carotid system: Patent without evidence of stenosis or dissection. Vertebral arteries: Patent without evidence of stenosis or dissection. Mildly dominant left vertebral artery. Skeleton:  No acute osseous abnormality or suspicious osseous lesion. Other neck: No evidence of cervical lymphadenopathy or mass. Upper chest: Clear lung apices. Review of the MIP images confirms the above findings CTA HEAD FINDINGS Anterior circulation: The internal carotid arteries are widely patent from skull base to carotid termini. ACAs and MCAs are patent without evidence of a proximal branch occlusion or significant proximal stenosis. No aneurysm is identified. Posterior circulation: The intracranial vertebral arteries are widely patent to the basilar. Patent PICA and SCA origins are seen bilaterally. The basilar artery is widely patent. Posterior communicating arteries are not clearly identified and may be diminutive or absent. Both PCAs are patent with mild branch vessel irregularity and attenuation but no evidence of a significant proximal stenosis. No aneurysm is identified. Venous sinuses: Patent. Anatomic variants: None. Review of the MIP images confirms the above findings IMPRESSION: 1. No evidence of acute intracranial abnormality. 2. Mild chronic small vessel ischemic disease in the cerebral white matter. 3. No large vessel occlusion, significant stenosis, or aneurysm in the head and neck. Electronically Signed   By: Sebastian Ache M.D.   On: 05/27/2020 17:31    Procedures Procedures (including critical care time)  Medications Ordered in ED Medications  meclizine (ANTIVERT) tablet 25 mg (25 mg Oral Given 05/27/20 1557)  diazepam (VALIUM) tablet 2 mg (2 mg Oral Given 05/27/20 1556)  iohexol (OMNIPAQUE) 350 MG/ML injection 80 mL (80 mLs Intravenous Contrast Given 05/27/20 1711)    ED Course  I have reviewed the triage vital signs and the nursing notes.  Pertinent labs & imaging results that were available during my care of the patient were reviewed by me and considered in my medical decision making (see chart for details).  Clinical Course as of 05/27/20 Ermalinda Barrios May 27, 2020  1820 At  reevaluation patient spinning resolved until he turned on his side to speak with me.  He began having spinning again and had to return midline which improved his symptoms. [AH]    Clinical Course User Index [AH] Arthor Captain, PA-C   MDM Rules/Calculators/A&P  UX:NATFTDD VS: BP (!) 144/96 (BP Location: Left Arm)   Pulse 65   Temp 97.7 F (36.5 C) (Oral)   Resp 20   Ht 5\' 8"  (1.727 m)   Wt 90.7 kg   SpO2 97%   BMI 30.41 kg/m   is gathered by patient and emr. Previous records obtained and reviewed. DDX:The patient's complaint of vertiog involves an extensive number of diagnostic and treatment options, and is a complaint that carries with it a high risk of complications, morbidity, and potential mortality. Given the large differential diagnosis, medical decision making is of high complexity. The emergent differential diagnosis for acute vertigo low includes peripheral causes such as BPPV, barotrauma, ear foreign body, Mnire's disease, infectious causes such as lip bronchitis, vestibular neuritis or Ramsay Hunt syndrome.  Other emergent causes are central such as cerebellar stroke, vertebrobasilar insufficiency, neoplastic causes, vertebral artery dissection, MS, neurosyphilis or tuberculosis, epilepsy or migraine.  Other causes include anemia, hyperviscosity syndrome, alcohol or aminoglycoside use, renal failure, hypoglycemia and thyroid disease.\ Labs: I ordered reviewed and interpreted labs which include CBC which shows elevated white blood cell count likely acute phase reaction as the patient is extremely nervous.  CMP shows mildly elevated creatinine just above baseline likely due to some mild dehydration or patient's hypertension.  UDS negative.  Normal PT/INR and APTT, Imaging: I ordered and reviewed images which included CT angiogram of the head and neck. I independently visualized and interpreted all imaging. There are no acute, significant  findings on today's images.  Widely patent arteries without any significant disease EKG: rate of 71 without arrhythmia Consults: MDM: Patient here with complaint of positional vertigo.  Work-up here in the emergency department shows normal CTAs of the head and neck.  I have extremely low suspicion for central cause.  Patient's symptoms are worse with movement and better when he is still.  He has improved significantly and had resolved up until he turned to the side which sent his symptoms on again.  Patient has been written out of work until he is at least 24 hours free of any room spinning dizziness.  He is to follow closely with ENT.  Patient appears otherwise appropriate for discharge at this time peer is ambulatory without ataxia, no active vomiting here in the emergency department Patient disposition:The patient appears reasonably screened and/or stabilized for discharge and I doubt any other medical condition or other [facility] requiring further screening, evaluation, or treatment in the ED at this time prior to discharge. I have discussed lab and/or imaging findings with the patient and answered all questions/concerns to the best of my ability.I have discussed return precautions and OP follow up.    Final Clinical Impression(s) / ED Diagnoses Final diagnoses:  Peripheral vertigo, unspecified laterality    Rx / DC Orders ED Discharge Orders         Ordered    meclizine (ANTIVERT) 25 MG tablet  3 times daily PRN        05/27/20 1743    diazepam (VALIUM) 2 MG tablet  Every 6 hours PRN        05/27/20 1743           05/29/20, PA-C 05/27/20 1825    05/29/20, MD 05/28/20 [phone]

## 2020-05-27 NOTE — ED Triage Notes (Signed)
Patient reports dizziness and nausea since last night. Denies vomiting. Reports the dizziness is intermittent and worsens when standing up. Denies fevers, chills, SOB. Hx of HTN.

## 2020-05-27 NOTE — Discharge Instructions (Addendum)
Take the Meclizine as directed. If your spinning and nausea are severe and not controlled with the meclizine take the valium. Do not drive until you are 24 hours free of the spinning. Follow up with Dr. Suszanne Conners.  Contact a health care provider if: You have a fever. Your condition gets worse or you develop new symptoms. Your family or friends notice any behavioral changes. You have nausea or vomiting that gets worse. You have numbness or a "pins and needles" sensation. Get help right away if you: Have difficulty speaking or moving. Are always dizzy. Faint. Develop severe headaches. Have weakness in your legs or arms. Have changes in your hearing or vision. Develop a stiff neck. Develop sensitivity to light.

## 2020-10-26 ENCOUNTER — Other Ambulatory Visit: Payer: Self-pay | Admitting: Urology

## 2021-08-04 IMAGING — CT CT ANGIO NECK
1 of 10 series · 6 of 33 positions shown · IV contrast (omnipaque)
Comparison: None.

CLINICAL DATA: Dizziness.  Nausea.



[Series 11: ax thin · axial · 0.39mm/px · z∈[+1358,+1614]mm · 6 of 358 slices shown]
[im 52/358  soft-tissue]
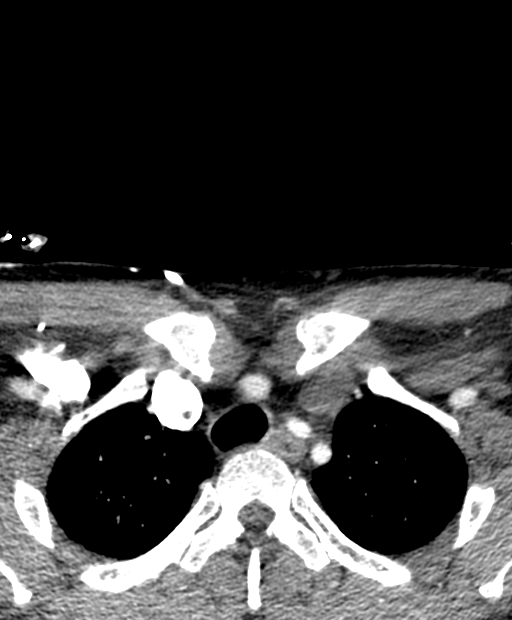
[im 103/358  bone]
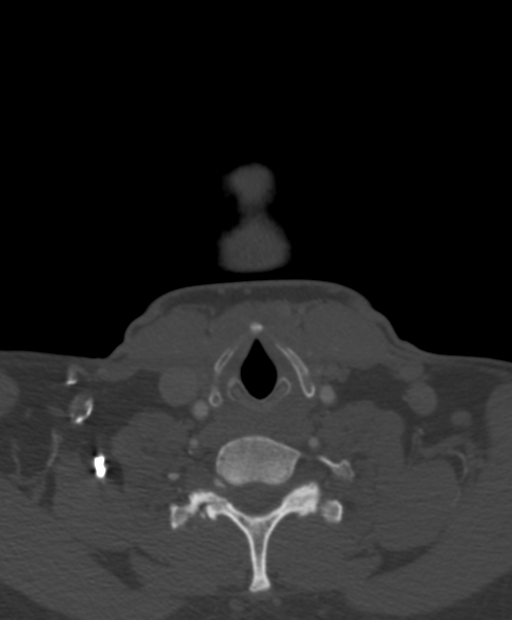
[im 154/358  soft-tissue]
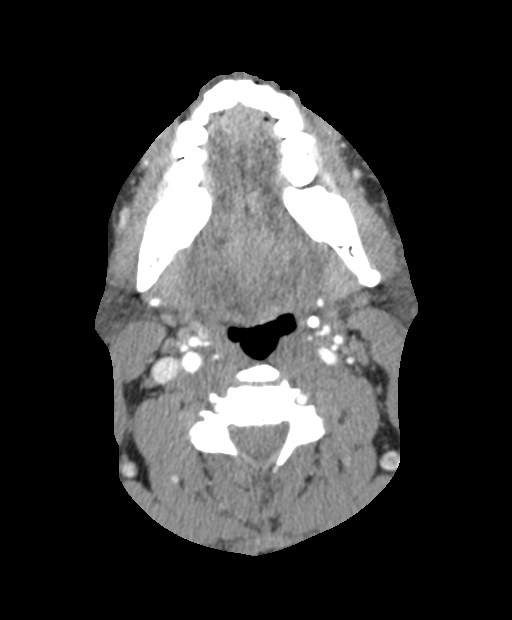
[im 205/358  bone]
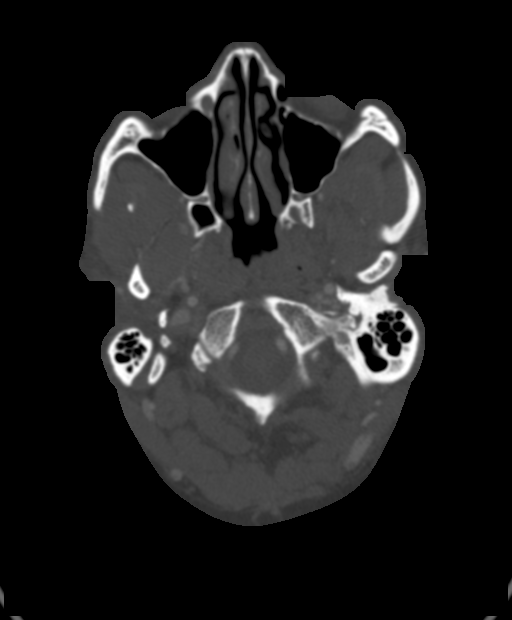
[im 256/358  soft-tissue]
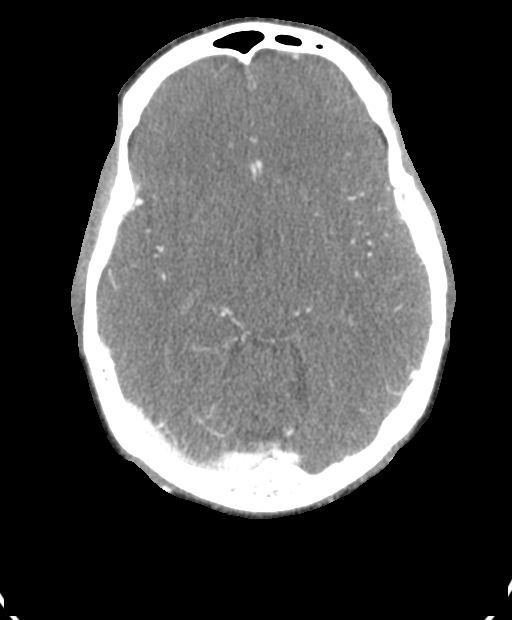
[im 307/358  bone]
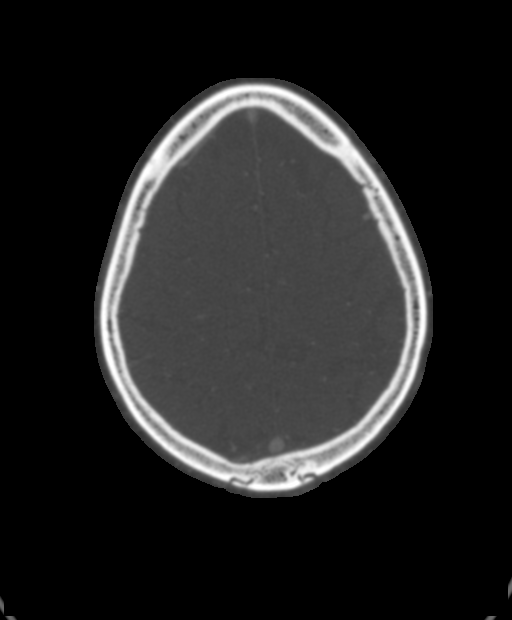

[6 of 33 positions shown; findings below may reference images not displayed]

FINDINGS: CT HEAD FINDINGS

Brain: There is no evidence of an acute infarct, intracranial
hemorrhage, mass, midline shift, or extra-axial fluid collection.
The ventricles and sulci are normal. Scattered hypodensities in the
cerebral white matter nonspecific but compatible with mild chronic
small vessel ischemic disease.

Vascular: No hyperdense vessel.

Skull: No fracture or suspicious osseous lesion.

Sinuses: Visualized paranasal sinuses and mastoid air cells are
clear.

Orbits: Unremarkable.

Review of the MIP images confirms the above findings

CTA NECK FINDINGS

Aortic arch: Standard 3 vessel aortic arch with widely patent arch
vessel origins.

Right carotid system: Patent without evidence of stenosis or
dissection.

Left carotid system: Patent without evidence of stenosis or
dissection.

Vertebral arteries: Patent without evidence of stenosis or
dissection. Mildly dominant left vertebral artery.

Skeleton: No acute osseous abnormality or suspicious osseous lesion.

Other neck: No evidence of cervical lymphadenopathy or mass.

Upper chest: Clear lung apices.

Review of the MIP images confirms the above findings

CTA HEAD FINDINGS

Anterior circulation: The internal carotid arteries are widely
patent from skull base to carotid termini. ACAs and MCAs are patent
without evidence of a proximal branch occlusion or significant
proximal stenosis. No aneurysm is identified.

Posterior circulation: The intracranial vertebral arteries are
widely patent to the basilar. Patent PICA and SCA origins are seen
bilaterally. The basilar artery is widely patent. Posterior
communicating arteries are not clearly identified and may be
diminutive or absent. Both PCAs are patent with mild branch vessel
irregularity and attenuation but no evidence of a significant
proximal stenosis. No aneurysm is identified.

Venous sinuses: Patent.

Anatomic variants: None.

Review of the MIP images confirms the above findings
IMPRESSION: 1. No evidence of acute intracranial abnormality.
2. Mild chronic small vessel ischemic disease in the cerebral white
matter.
3. No large vessel occlusion, significant stenosis, or aneurysm in
the head and neck.
